# Patient Record
Sex: Female | Born: 1980 | Race: White | Hispanic: No | Smoking: Current every day smoker
Health system: Southern US, Community
[De-identification: ages and names within clinical notes are randomized; demographics above are authoritative.]

## PROBLEM LIST (undated history)

## (undated) DIAGNOSIS — E079 Disorder of thyroid, unspecified: Secondary | ICD-10-CM

## (undated) HISTORY — PX: TUBAL LIGATION: SHX77

---
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
## 2000-10-26 ENCOUNTER — Emergency Department (HOSPITAL_COMMUNITY): Admission: EM | Admit: 2000-10-26 | Discharge: 2000-10-26 | Payer: Self-pay | Admitting: Internal Medicine

## 2000-12-19 ENCOUNTER — Emergency Department (HOSPITAL_COMMUNITY): Admission: EM | Admit: 2000-12-19 | Discharge: 2000-12-19 | Payer: Self-pay | Admitting: Emergency Medicine

## 2001-01-26 ENCOUNTER — Emergency Department (HOSPITAL_COMMUNITY): Admission: EM | Admit: 2001-01-26 | Discharge: 2001-01-26 | Payer: Self-pay | Admitting: Emergency Medicine

## 2001-06-22 ENCOUNTER — Other Ambulatory Visit: Admission: RE | Admit: 2001-06-22 | Discharge: 2001-06-22 | Payer: Self-pay | Admitting: Family Medicine

## 2001-08-15 ENCOUNTER — Encounter: Payer: Self-pay | Admitting: Family Medicine

## 2001-08-15 ENCOUNTER — Ambulatory Visit (HOSPITAL_COMMUNITY): Admission: RE | Admit: 2001-08-15 | Discharge: 2001-08-15 | Payer: Self-pay | Admitting: Family Medicine

## 2001-09-04 ENCOUNTER — Inpatient Hospital Stay (HOSPITAL_COMMUNITY): Admission: AD | Admit: 2001-09-04 | Discharge: 2001-09-04 | Payer: Self-pay | Admitting: *Deleted

## 2001-11-10 ENCOUNTER — Inpatient Hospital Stay (HOSPITAL_COMMUNITY): Admission: AD | Admit: 2001-11-10 | Discharge: 2001-11-10 | Payer: Self-pay | Admitting: Family Medicine

## 2001-12-29 ENCOUNTER — Encounter (HOSPITAL_COMMUNITY): Admission: RE | Admit: 2001-12-29 | Discharge: 2001-12-29 | Payer: Self-pay | Admitting: Family Medicine

## 2001-12-29 ENCOUNTER — Inpatient Hospital Stay (HOSPITAL_COMMUNITY): Admission: AD | Admit: 2001-12-29 | Discharge: 2001-12-31 | Payer: Self-pay | Admitting: Family Medicine

## 2003-08-22 ENCOUNTER — Other Ambulatory Visit: Admission: RE | Admit: 2003-08-22 | Discharge: 2003-08-22 | Payer: Self-pay | Admitting: Family Medicine

## 2003-09-19 ENCOUNTER — Ambulatory Visit (HOSPITAL_COMMUNITY): Admission: RE | Admit: 2003-09-19 | Discharge: 2003-09-19 | Payer: Self-pay | Admitting: Family Medicine

## 2003-12-18 ENCOUNTER — Ambulatory Visit (HOSPITAL_COMMUNITY): Admission: RE | Admit: 2003-12-18 | Discharge: 2003-12-18 | Payer: Self-pay | Admitting: Family Medicine

## 2004-01-14 ENCOUNTER — Ambulatory Visit (HOSPITAL_COMMUNITY): Admission: RE | Admit: 2004-01-14 | Discharge: 2004-01-14 | Payer: Self-pay | Admitting: Family Medicine

## 2004-01-22 ENCOUNTER — Inpatient Hospital Stay (HOSPITAL_COMMUNITY): Admission: AD | Admit: 2004-01-22 | Discharge: 2004-01-22 | Payer: Self-pay | Admitting: Family Medicine

## 2004-02-13 ENCOUNTER — Inpatient Hospital Stay (HOSPITAL_COMMUNITY): Admission: AD | Admit: 2004-02-13 | Discharge: 2004-02-16 | Payer: Self-pay | Admitting: Family Medicine

## 2004-02-15 ENCOUNTER — Encounter (INDEPENDENT_AMBULATORY_CARE_PROVIDER_SITE_OTHER): Payer: Self-pay | Admitting: Specialist

## 2005-06-27 ENCOUNTER — Emergency Department (HOSPITAL_COMMUNITY): Admission: EM | Admit: 2005-06-27 | Discharge: 2005-06-27 | Payer: Self-pay | Admitting: Emergency Medicine

## 2006-06-28 ENCOUNTER — Ambulatory Visit: Payer: Self-pay | Admitting: Family Medicine

## 2007-01-06 ENCOUNTER — Ambulatory Visit: Payer: Self-pay | Admitting: Family Medicine

## 2008-05-23 ENCOUNTER — Other Ambulatory Visit: Admission: RE | Admit: 2008-05-23 | Discharge: 2008-05-23 | Payer: Self-pay | Admitting: Family Medicine

## 2010-01-23 ENCOUNTER — Emergency Department (HOSPITAL_COMMUNITY): Admission: EM | Admit: 2010-01-23 | Discharge: 2010-01-23 | Payer: Self-pay | Admitting: Emergency Medicine

## 2010-12-12 ENCOUNTER — Emergency Department (HOSPITAL_COMMUNITY)
Admission: EM | Admit: 2010-12-12 | Discharge: 2010-12-13 | Disposition: A | Discharge status: Home / Self Care | Payer: BC Managed Care – PPO | Attending: Emergency Medicine | Admitting: Emergency Medicine

## 2010-12-12 ENCOUNTER — Emergency Department (HOSPITAL_COMMUNITY): Payer: BC Managed Care – PPO

## 2010-12-12 DIAGNOSIS — E039 Hypothyroidism, unspecified: Secondary | ICD-10-CM | POA: Insufficient documentation

## 2010-12-12 DIAGNOSIS — R5381 Other malaise: Secondary | ICD-10-CM | POA: Insufficient documentation

## 2010-12-12 DIAGNOSIS — R6883 Chills (without fever): Secondary | ICD-10-CM | POA: Insufficient documentation

## 2010-12-12 DIAGNOSIS — R079 Chest pain, unspecified: Secondary | ICD-10-CM | POA: Insufficient documentation

## 2010-12-12 DIAGNOSIS — F17200 Nicotine dependence, unspecified, uncomplicated: Secondary | ICD-10-CM | POA: Insufficient documentation

## 2010-12-18 NOTE — Op Note (Signed)
NAMECHAPEL, Tammy Wiley                        ACCOUNT NO.:  [id number]   MEDICAL RECORD NO.:  [id number]                   PATIENT TYPE:  INP   LOCATION:  9133                                 FACILITY:  [facility]   PHYSICIAN:  Conni Elliot, M.D.             DATE OF BIRTH:  04/17/1981   DATE OF PROCEDURE:  02/15/2004  DATE OF DISCHARGE:  02/16/2004                                 OPERATIVE REPORT   PREOPERATIVE DIAGNOSIS:  Desires surgical sterilization.   POSTOPERATIVE DIAGNOSIS:  Desires surgical sterilization.   OPERATION:  Modified Pomeroy tubal ligation.   SURGEON:  Conni Elliot, M.D.   DESCRIPTION OF PROCEDURE:  After placing the patient under continuous lumbar  epidural anesthetic with the supine.  Abdomen is prepped and draped in  sterile fashion.  A periumbilical incision is made.  Incision made in the  skin and subcutaneous fashion.  Peritoneum entered.  The right fallopian  tube was identified, grasped with Babcock clamp, followed to fimbriated end.  Single tube was brought into the operative field, doubly suture ligated and  1.5 to 2 cm segment was incised.  Hemostasis was adequate.  Similar  procedure was done on the opposite side.  Hemostasis again adequate.  Anterior peritoneum and fascia as well as skin closed in routine fashion.   ESTIMATED BLOOD LOSS:  Less than 10 mL.   Sponge, needle and instrument counts were correct.                                               Conni Elliot, M.D.    ASG/MEDQ  D:  02/15/2004  T:  02/16/2004  Job:  [id number]

## 2011-10-23 ENCOUNTER — Encounter (HOSPITAL_COMMUNITY): Payer: Self-pay | Admitting: *Deleted

## 2011-10-23 ENCOUNTER — Emergency Department (HOSPITAL_COMMUNITY): Payer: Medicaid Other

## 2011-10-23 ENCOUNTER — Emergency Department (HOSPITAL_COMMUNITY)
Admission: EM | Admit: 2011-10-23 | Discharge: 2011-10-23 | Disposition: A | Discharge status: Home / Self Care | Payer: Medicaid Other | Attending: Emergency Medicine | Admitting: Emergency Medicine

## 2011-10-23 DIAGNOSIS — R209 Unspecified disturbances of skin sensation: Secondary | ICD-10-CM | POA: Insufficient documentation

## 2011-10-23 DIAGNOSIS — E079 Disorder of thyroid, unspecified: Secondary | ICD-10-CM | POA: Insufficient documentation

## 2011-10-23 DIAGNOSIS — M25519 Pain in unspecified shoulder: Secondary | ICD-10-CM | POA: Insufficient documentation

## 2011-10-23 DIAGNOSIS — M542 Cervicalgia: Secondary | ICD-10-CM | POA: Insufficient documentation

## 2011-10-23 DIAGNOSIS — R5381 Other malaise: Secondary | ICD-10-CM | POA: Insufficient documentation

## 2011-10-23 DIAGNOSIS — M2550 Pain in unspecified joint: Secondary | ICD-10-CM | POA: Insufficient documentation

## 2011-10-23 DIAGNOSIS — IMO0001 Reserved for inherently not codable concepts without codable children: Secondary | ICD-10-CM | POA: Insufficient documentation

## 2011-10-23 DIAGNOSIS — M5412 Radiculopathy, cervical region: Secondary | ICD-10-CM

## 2011-10-23 HISTORY — DX: Disorder of thyroid, unspecified: E07.9

## 2011-10-23 MED ORDER — HYDROCODONE-ACETAMINOPHEN 5-325 MG PO TABS
1.0000 | ORAL_TABLET | Freq: Once | ORAL | Status: AC
  Administered 2011-10-23: 1 via ORAL
  Filled 2011-10-23: qty 1

## 2011-10-23 MED ORDER — CYCLOBENZAPRINE HCL 10 MG PO TABS: 10.0000 mg | ORAL_TABLET | Freq: Two times a day (BID) | ORAL | Status: AC | PRN

## 2011-10-23 MED ORDER — HYDROCODONE-ACETAMINOPHEN 5-325 MG PO TABS: 1.0000 | ORAL_TABLET | ORAL | Status: AC | PRN

## 2011-10-23 NOTE — Discharge Instructions (Signed)
Cervical Radiculopathy Cervical radiculopathy happens when a nerve in the neck is pinched or bruised by a slipped (herniated) disk or by arthritic changes in the bones of the cervical spine. This can occur due to an injury or as part of the normal aging process. Pressure on the cervical nerves can cause pain or numbness that runs from your neck all the way down into your arm and fingers. CAUSES  There are many possible causes, including:  Injury.   Muscle tightness in the neck from overuse.   Swollen, painful joints (arthritis).   Breakdown or degeneration in the bones and joints of the spine (spondylosis) due to aging.   Bone spurs that may develop near the cervical nerves.  SYMPTOMS  Symptoms include pain, weakness, or numbness in the affected arm and hand. Pain can be severe or irritating. Symptoms may be worse when extending or turning the neck. DIAGNOSIS  Your caregiver will ask about your symptoms and do a physical exam. He or she may test your strength and reflexes. X-rays, CT scans, and MRI scans may be needed in cases of injury or if the symptoms do not go away after a period of time. Electromyography (EMG) or nerve conduction testing may be done to study how your nerves and muscles are working. TREATMENT  Your caregiver may recommend certain exercises to help relieve your symptoms. Cervical radiculopathy can, and often does, get better with time and treatment. If your problems continue, treatment options may include:  Wearing a soft collar for short periods of time.   Physical therapy to strengthen the neck muscles.   Medicines, such as nonsteroidal anti-inflammatory drugs (NSAIDs), oral corticosteroids, or spinal injections.   Surgery. Different types of surgery may be done depending on the cause of your problems.  HOME CARE INSTRUCTIONS   Put ice on the affected area.   Put ice in a plastic bag.   Place a towel between your skin and the bag.   Leave the ice on for 15  to 20 minutes, 3 to 4 times a day or as directed by your caregiver.   Use a flat pillow when you sleep.   Only take over-the-counter or prescription medicines for pain, discomfort, or fever as directed by your caregiver.   If physical therapy was prescribed, follow your caregiver's directions.   If a soft collar was prescribed, use it as directed.  SEEK IMMEDIATE MEDICAL CARE IF:   Your pain gets much worse and cannot be controlled with medicines.   You have weakness or numbness in your hand, arm, face, or leg.   You have a high fever or a stiff, rigid neck.   You lose bowel or bladder control (incontinence).   You have trouble with walking, balance, or speaking.  MAKE SURE YOU:   Understand these instructions.   Will watch your condition.   Will get help right away if you are not doing well or get worse.  Document Released: 04/13/2001 Document Revised: 07/08/2011 Document Reviewed: 03/02/2011 Exit[employer] Patient Information 2012 [employer]. 

## 2011-10-23 NOTE — ED Notes (Signed)
4 month history of left shoulder numbness radiating down left arm associated with pain and tingling in finger tips. Movement increases pain.Notes cervicale neck pain. Job: Art gallery manager at [employer]. Uses left arm more. Denies any injury. Comes today because pain worse through the night.

## 2011-10-23 NOTE — ED Provider Notes (Signed)
History     CSN: [id number]  Arrival date & time 10/23/11  1534   First MD Initiated Contact with Patient 10/23/11 1613      Chief Complaint  Patient presents with  . Shoulder Pain    left    (Consider location/radiation/quality/duration/timing/severity/associated sxs/prior treatment) HPI Comments: Patient here with left sided neck pain with radiation down her left arm for the past 4 months - she reports pain worse with movement of the arm, pain and occasional numbness and tingling into her fingertips - reports pain radiates down the entire arm - reports subjective weakness in the arm and decreased range of motion of the shoulder and neck.  Patient is a 31 y.o. female presenting with shoulder pain. The history is provided by the patient. No language interpreter was used.  Shoulder Pain This is a new problem. The current episode started more than 1 month ago. The problem occurs constantly. The problem has been gradually worsening. Associated symptoms include arthralgias, myalgias, neck pain, numbness and weakness. Pertinent negatives include no abdominal pain, anorexia, change in bowel habit, chest pain, chills, congestion, coughing, diaphoresis, fatigue, fever, headaches, joint swelling, nausea, rash, sore throat, swollen glands, urinary symptoms, vertigo, visual change or vomiting. The symptoms are aggravated by bending. She has tried nothing for the symptoms. The treatment provided no relief.    Past Medical History  Diagnosis Date  . Thyroid disease     History reviewed. No pertinent past surgical history.  No family history on file.  History  Substance Use Topics  . Smoking status: Not on file  . Smokeless tobacco: Not on file  . Alcohol Use:     OB History    Grav Para Term Preterm Abortions TAB SAB Ect Mult Living                  Review of Systems  Constitutional: Negative for fever, chills, diaphoresis and fatigue.  HENT: Positive for neck pain. Negative for  congestion and sore throat.   Respiratory: Negative for cough.   Cardiovascular: Negative for chest pain.  Gastrointestinal: Negative for nausea, vomiting, abdominal pain, anorexia and change in bowel habit.  Musculoskeletal: Positive for myalgias and arthralgias. Negative for joint swelling.  Skin: Negative for rash.  Neurological: Positive for weakness and numbness. Negative for vertigo and headaches.  All other systems reviewed and are negative.    Allergies  Review of patient's allergies indicates no known allergies.  Home Medications   Current Outpatient Rx  Name Route Sig Dispense Refill  . LEVOTHYROXINE SODIUM 200 MCG PO TABS Oral Take 200 mcg by mouth daily.      BP 135/87  Pulse 72  Temp(Src) 98.7 F (37.1 C) (Oral)  Resp 16  SpO2 100%  Physical Exam  Nursing note and vitals reviewed. Constitutional: She is oriented to person, place, and time. She appears well-developed and well-nourished. No distress.  HENT:  Head: Normocephalic and atraumatic.  Right Ear: External ear normal.  Left Ear: External ear normal.  Nose: Nose normal.  Mouth/Throat: Oropharynx is clear and moist. No oropharyngeal exudate.  Eyes: Conjunctivae are normal. Pupils are equal, round, and reactive to light. No scleral icterus.  Neck: Trachea normal. Neck supple. Spinous process tenderness and muscular tenderness present. Decreased range of motion present. No edema and no erythema present. No mass present.    Cardiovascular: Normal rate, regular rhythm and normal heart sounds.  Exam reveals no gallop and no friction rub.   No murmur heard. Pulmonary/Chest: Effort  normal and breath sounds normal. No respiratory distress. She has no wheezes. She has no rales. She exhibits no tenderness.  Abdominal: Soft. Bowel sounds are normal. She exhibits no distension. There is no tenderness.  Musculoskeletal:       Left shoulder: She exhibits decreased range of motion, tenderness and bony tenderness. She  exhibits no swelling, no effusion, no deformity, normal pulse and normal strength.  Neurological: She is alert and oriented to person, place, and time. She displays normal reflexes. No cranial nerve deficit. She exhibits normal muscle tone. Coordination normal.  Skin: Skin is warm and dry. No rash noted. No erythema. No pallor.  Psychiatric: She has a normal mood and affect. Her behavior is normal. Judgment and thought content normal.    ED Course  Procedures (including critical care time)  Labs Reviewed - No data to display No results found.  No results found for this or any previous visit. Ct Cervical Spine Wo Contrast  10/23/2011  *RADIOLOGY REPORT*  Clinical Data: 70-month history of left shoulder numbness radiating to the left arm with pain and tingling in the fingertips.  CT CERVICAL SPINE WITHOUT CONTRAST  Technique:  Multidetector CT imaging of the cervical spine was performed. Multiplanar CT image reconstructions were also generated.  Comparison: None.  Findings: The neck is in mild flexion.  Vertebral body height and alignment are normal.  No notable degenerative change is identified by CT scan.  Lung apices demonstrate some paraseptal emphysema.  IMPRESSION:  1.  Normal appearing cervical spine. 2.  Paraseptal emphysema.  Original Report Authenticated By: Bernadene Bell. Maricela Curet, M.D.     Cervical Radiculopathy    MDM  Patient without signs of cord impingement though she does have periods of numbness, no weakness noted on examination - CT scan reassuring - will give pain medication and muscle relaxer for short term and patient to follow up with PCP for further imaging and referral.        Scarlette Calico C. [place], [place] 10/23/11 1758

## 2011-10-23 NOTE — ED Provider Notes (Signed)
Medical screening examination/treatment/procedure(s) were performed by non-physician practitioner and as supervising physician I was immediately available for consultation/collaboration.   Gale Hulse, MD 10/23/11 2309 

## 2013-04-09 ENCOUNTER — Encounter (HOSPITAL_COMMUNITY): Payer: Self-pay | Admitting: *Deleted

## 2013-04-09 ENCOUNTER — Emergency Department (HOSPITAL_COMMUNITY)
Admission: EM | Admit: 2013-04-09 | Discharge: 2013-04-09 | Disposition: A | Discharge status: Home / Self Care | Payer: Medicaid Other | Attending: Emergency Medicine | Admitting: Emergency Medicine

## 2013-04-09 DIAGNOSIS — Z9851 Tubal ligation status: Secondary | ICD-10-CM | POA: Insufficient documentation

## 2013-04-09 DIAGNOSIS — Z79899 Other long term (current) drug therapy: Secondary | ICD-10-CM | POA: Insufficient documentation

## 2013-04-09 DIAGNOSIS — G479 Sleep disorder, unspecified: Secondary | ICD-10-CM | POA: Insufficient documentation

## 2013-04-09 DIAGNOSIS — R5383 Other fatigue: Secondary | ICD-10-CM

## 2013-04-09 DIAGNOSIS — F17200 Nicotine dependence, unspecified, uncomplicated: Secondary | ICD-10-CM | POA: Insufficient documentation

## 2013-04-09 DIAGNOSIS — R5381 Other malaise: Secondary | ICD-10-CM | POA: Insufficient documentation

## 2013-04-09 DIAGNOSIS — E079 Disorder of thyroid, unspecified: Secondary | ICD-10-CM | POA: Insufficient documentation

## 2013-04-09 LAB — CBC
HCT: 37.5 % (ref 36.0–46.0)
MCV: 89.9 fL (ref 78.0–100.0)
Platelets: 227 10*3/uL (ref 150–400)
RBC: 4.17 MIL/uL (ref 3.87–5.11)
WBC: 7.9 10*3/uL (ref 4.0–10.5)

## 2013-04-09 NOTE — ED Notes (Signed)
Pt states "i'm just here to have my thyroid checked"

## 2013-04-09 NOTE — ED Notes (Signed)
Reports needs to have thyroid checked.  Is having unintentional weight loss of 40 lbs within past month, unable to stay awake and is having bruising.  C/o pain all over.

## 2013-04-09 NOTE — ED Provider Notes (Signed)
CSN: [id number]     Arrival date & time 04/09/13  1716 History   First MD Initiated Contact with Patient 04/09/13 1753     Chief Complaint  Patient presents with  . Weight Loss   (Consider location/radiation/quality/duration/timing/severity/associated sxs/prior Treatment) HPI Comments: Patient's aunt, who is a Research scientist (medical), sent her here to get her an anemia panel checked and thyroid studies checked. The patient has been experiencing a 40 pound weight loss and sleepless nights over the past several months. The patient has not been tried to lose weight. She's her stress at home because her husband left her and took her children. She also reports many other things have been going on with her life. She denies any active bleeding, heavier periods of normal, rectal bleeding. She just states she is fatigued and is doesn't have energy. She denies any suicidal or homicidal ideations.  Patient is a 32 y.o. female presenting with general illness. The history is provided by the patient.  Illness Location:  Diffuse Quality:  Weight loss, fatigue Severity:  Mild Onset quality:  Gradual Timing:  Constant Progression:  Worsening Chronicity:  New Associated symptoms: fatigue   Associated symptoms: no abdominal pain, no chest pain, no congestion, no cough, no diarrhea, no ear pain, no fever, no headaches, no loss of consciousness, no myalgias, no nausea, no rash, no rhinorrhea, no shortness of breath, no sore throat, no vomiting and no wheezing     Past Medical History  Diagnosis Date  . Thyroid disease    Past Surgical History  Procedure Laterality Date  . Tubal ligation     No family history on file. History  Substance Use Topics  . Smoking status: Current Every Day Smoker    Types: Cigarettes  . Smokeless tobacco: Not on file  . Alcohol Use: No   OB History   Grav Para Term Preterm Abortions TAB SAB Ect Mult Living                 Review of Systems  Constitutional: Positive for  fatigue. Negative for fever.  HENT: Negative for ear pain, congestion, sore throat and rhinorrhea.   Respiratory: Negative for cough, shortness of breath and wheezing.   Cardiovascular: Negative for chest pain.  Gastrointestinal: Negative for nausea, vomiting, abdominal pain and diarrhea.  Musculoskeletal: Negative for myalgias.  Skin: Negative for rash.  Neurological: Negative for loss of consciousness and headaches.  All other systems reviewed and are negative.    Allergies  Review of patient's allergies indicates no known allergies.  Home Medications   Current Outpatient Rx  Name  Route  Sig  Dispense  Refill  . levothyroxine (SYNTHROID, LEVOTHROID) 150 MCG tablet   Oral   Take 150 mcg by mouth daily.          BP 157/95  Pulse 60  Temp(Src) 98.5 F (36.9 C) (Oral)  Resp 16  Ht 5\' 4"  (1.626 m)  Wt 135 lb 3 oz (61.321 kg)  BMI 23.19 kg/m2  SpO2 100%  LMP 04/02/2013 Physical Exam  Nursing note and vitals reviewed. Constitutional: She is oriented to person, place, and time. She appears well-developed and well-nourished. No distress.  HENT:  Head: Normocephalic and atraumatic.  Eyes: EOM are normal. Pupils are equal, round, and reactive to light.  Neck: Normal range of motion. Neck supple.  Cardiovascular: Normal rate and regular rhythm.  Exam reveals no friction rub.   No murmur heard. Pulmonary/Chest: Effort normal and breath sounds normal. No respiratory distress. She  has no wheezes. She has no rales.  Abdominal: Soft. She exhibits no distension. There is no tenderness. There is no rebound.  Musculoskeletal: Normal range of motion. She exhibits no edema.  Neurological: She is alert and oriented to person, place, and time.  Skin: She is not diaphoretic.    ED Course  Procedures (including critical care time) Labs Review Labs Reviewed  TSH  T4, FREE  CBC   Imaging Review No results found.  MDM   1. Fatigue    32 year old female here requesting labs  for concern your thyroid medication. Our lab will not get thyroid tests back, however she is persistent that she needs these labs. I explained to her this is not good use of ED resources, however she does understand and persistent that she needs to labs. I will check the studies for her and informed her she is to follow up with her regular doctor for adjustments of her thyroid medication. Not anemic. Stable for discharge.   Dagmar Hait, MD 04/09/13 2059

## 2013-06-07 ENCOUNTER — Other Ambulatory Visit: Payer: Self-pay

## 2016-03-22 ENCOUNTER — Ambulatory Visit: Payer: Self-pay | Admitting: Family Medicine

## 2016-03-30 ENCOUNTER — Ambulatory Visit: Payer: Self-pay | Admitting: Family Medicine

## 2016-03-31 ENCOUNTER — Encounter: Payer: Self-pay | Admitting: Family Medicine

## 2018-12-18 ENCOUNTER — Other Ambulatory Visit: Payer: Self-pay

## 2018-12-18 ENCOUNTER — Emergency Department (HOSPITAL_COMMUNITY)
Admission: EM | Admit: 2018-12-18 | Discharge: 2018-12-18 | Disposition: A | Discharge status: Home / Self Care | Payer: Self-pay | Attending: Emergency Medicine | Admitting: Emergency Medicine

## 2018-12-18 ENCOUNTER — Encounter (HOSPITAL_COMMUNITY): Payer: Self-pay | Admitting: Emergency Medicine

## 2018-12-18 DIAGNOSIS — K0889 Other specified disorders of teeth and supporting structures: Secondary | ICD-10-CM | POA: Insufficient documentation

## 2018-12-18 DIAGNOSIS — F17210 Nicotine dependence, cigarettes, uncomplicated: Secondary | ICD-10-CM | POA: Insufficient documentation

## 2018-12-18 MED ORDER — AMOXICILLIN-POT CLAVULANATE 875-125 MG PO TABS
1.0000 | ORAL_TABLET | Freq: Once | ORAL | Status: AC
  Administered 2018-12-18: 1 via ORAL
  Filled 2018-12-18: qty 1

## 2018-12-18 MED ORDER — BENZOCAINE 10 % MT GEL: 1.0000 "application " | OROMUCOSAL | 0 refills | Status: AC | PRN

## 2018-12-18 MED ORDER — AMOXICILLIN-POT CLAVULANATE 875-125 MG PO TABS: 1.0000 | ORAL_TABLET | Freq: Two times a day (BID) | ORAL | 0 refills | Status: AC

## 2018-12-18 MED ORDER — IBUPROFEN 400 MG PO TABS
600.0000 mg | ORAL_TABLET | Freq: Once | ORAL | Status: AC
  Administered 2018-12-18: 600 mg via ORAL
  Filled 2018-12-18: qty 2

## 2018-12-18 NOTE — Discharge Instructions (Signed)
Please take entire course of antibiotics as directed.  Continue using ibuprofen and Tylenol, as well as prescribed Orajel for pain.  You will need to follow-up with your dentist for continued management of this.  Return to the emergency department for fevers, swelling or pain under the tongue or in the neck, difficulty breathing or swallowing or any other new or concerning symptoms. 

## 2018-12-18 NOTE — ED Triage Notes (Signed)
Left upper toothache since Friday.

## 2018-12-18 NOTE — ED Provider Notes (Signed)
[facility] EMERGENCY DEPARTMENT Provider Note   CSN: [id number] Arrival date & time: 12/18/18  1837    History   Chief Complaint Chief Complaint  Patient presents with  . Dental Pain    HPI Tammy Wiley is a 38 y.o. female.     Tammy Wiley is a 38 y.o. female with a history of thyroid disease, who presents to the emergency department for evaluation of dental pain.  She reports a toothache over the left upper molars that started ago.  Pain was initially mild but has become a constant throbbing ache.  She noticed some mild facial swelling over her left cheek over the weekend.  She has not had any fever.  She denies any pain or swelling under the tongue, no difficulty swallowing, no pain or stiffness in the neck.  She took 1 dose of ibuprofen with some improvement of her eye, has not taken anything else for pain.  Reports a broken tooth in this area but no prior history of infections.  Does not have a dentist that she regularly follows with.  Denies any relieving factors.     Past Medical History:  Diagnosis Date  . Thyroid disease     There are no active problems to display for this patient.   Past Surgical History:  Procedure Laterality Date  . TUBAL LIGATION       OB History   No obstetric history on file.      Home Medications    Prior to Admission medications   Medication Sig Start Date End Date Taking? Authorizing Provider  amoxicillin-clavulanate (AUGMENTIN) 875-125 MG tablet Take 1 tablet by mouth 2 (two) times daily. One po bid x 7 days 12/18/18   Dartha Lodge, PA-C  benzocaine (ORAJEL) 10 % mucosal gel Use as directed 1 application in the mouth or throat as needed for mouth pain. 12/18/18   Dartha Lodge, PA-C  levothyroxine (SYNTHROID, LEVOTHROID) 150 MCG tablet Take 150 mcg by mouth daily.    [provider]    Family History No family history on file.  Social History Social History   Tobacco Use  . Smoking status: Current Every  Day Smoker    Packs/day: 1.00    Types: Cigarettes  . Smokeless tobacco: Never Used  Substance Use Topics  . Alcohol use: No  . Drug use: No     Allergies   Tramadol   Review of Systems Review of Systems  Constitutional: Negative for chills and fever.  HENT: Positive for dental problem and facial swelling.   Eyes: Negative for pain.  Respiratory: Negative for stridor.   Gastrointestinal: Negative for nausea and vomiting.  Musculoskeletal: Negative for neck stiffness.  Skin: Negative for color change and rash.     Physical Exam Updated Vital Signs BP (!) 142/93   Pulse 63   Temp 98.1 F (36.7 C)   Resp 18   Ht  (1.651 m)   Wt 56.7 kg   LMP 12/13/2018 (Approximate)   SpO2 97%   BMI 20.80 kg/m   Physical Exam Vitals signs and nursing note reviewed.  Constitutional:      General: She is not in acute distress.    Appearance: Normal appearance. She is well-developed and normal weight. She is not diaphoretic.  HENT:     Head: Normocephalic and atraumatic.     Mouth/Throat:     Comments: Erythema of the gums over the left upper second molar which is broken and decaying.  There is no drainage or obvious abscess, small amount of swelling over the left cheek but no palpable area of fluctuance.  There is no sublingual tenderness or induration.  Posterior oropharynx clear.  Tolerating secretions, normal phonation, no trismus. Eyes:     General:        Right eye: No discharge.        Left eye: No discharge.  Neck:     Musculoskeletal: Neck supple.     Comments: No stridor, no tenderness or masses. Pulmonary:     Effort: Pulmonary effort is normal. No respiratory distress.  Skin:    General: Skin is warm and dry.  Neurological:     Mental Status: She is alert.     Coordination: Coordination normal.  Psychiatric:        Mood and Affect: Mood normal.        Behavior: Behavior normal.      ED Treatments / Results  Labs (all labs ordered are listed, but only  abnormal results are displayed) Labs Reviewed - No data to display  EKG None  Radiology No results found.  Procedures Procedures (including critical care time)  Medications Ordered in ED Medications  ibuprofen (ADVIL) tablet 600 mg (has no administration in time range)  amoxicillin-clavulanate (AUGMENTIN) 875-125 MG per tablet 1 tablet (has no administration in time range)     Initial Impression / Assessment and Plan / ED Course  I have reviewed the triage vital signs and the nursing notes.  Pertinent labs & imaging results that were available during my care of the patient were reviewed by me and considered in my medical decision making (see chart for details).  Patient with toothache.  No gross abscess.  Exam unconcerning for Ludwig's angina or spread of infection.  Will treat withaugmentin and anti-inflammatories medicine.  Urged patient to follow-up with dentist, dental resources provided.   Final Clinical Impressions(s) / ED Diagnoses   Final diagnoses:  Toothache    ED Discharge Orders         Ordered    amoxicillin-clavulanate (AUGMENTIN) 875-125 MG tablet  2 times daily     12/18/18 2052    benzocaine (ORAJEL) 10 % mucosal gel  As needed     12/18/18 2052           Dartha Lodge, [place] 12/19/18 1400    Loren Racer, MD 12/19/18 [phone]

## 2019-01-27 ENCOUNTER — Encounter (HOSPITAL_COMMUNITY): Payer: Self-pay | Admitting: Emergency Medicine

## 2019-01-27 ENCOUNTER — Emergency Department (HOSPITAL_COMMUNITY)
Admission: EM | Admit: 2019-01-27 | Discharge: 2019-01-27 | Disposition: A | Discharge status: Home / Self Care | Payer: Self-pay | Attending: Emergency Medicine | Admitting: Emergency Medicine

## 2019-01-27 ENCOUNTER — Other Ambulatory Visit: Payer: Self-pay

## 2019-01-27 ENCOUNTER — Emergency Department (HOSPITAL_COMMUNITY): Payer: Self-pay

## 2019-01-27 DIAGNOSIS — M79641 Pain in right hand: Secondary | ICD-10-CM | POA: Insufficient documentation

## 2019-01-27 DIAGNOSIS — S0181XA Laceration without foreign body of other part of head, initial encounter: Secondary | ICD-10-CM

## 2019-01-27 DIAGNOSIS — Y999 Unspecified external cause status: Secondary | ICD-10-CM | POA: Insufficient documentation

## 2019-01-27 DIAGNOSIS — M25512 Pain in left shoulder: Secondary | ICD-10-CM | POA: Insufficient documentation

## 2019-01-27 DIAGNOSIS — W208XXA Other cause of strike by thrown, projected or falling object, initial encounter: Secondary | ICD-10-CM | POA: Insufficient documentation

## 2019-01-27 DIAGNOSIS — F17210 Nicotine dependence, cigarettes, uncomplicated: Secondary | ICD-10-CM | POA: Insufficient documentation

## 2019-01-27 DIAGNOSIS — Y939 Activity, unspecified: Secondary | ICD-10-CM | POA: Insufficient documentation

## 2019-01-27 DIAGNOSIS — S01511A Laceration without foreign body of lip, initial encounter: Secondary | ICD-10-CM | POA: Insufficient documentation

## 2019-01-27 DIAGNOSIS — Z79899 Other long term (current) drug therapy: Secondary | ICD-10-CM | POA: Insufficient documentation

## 2019-01-27 DIAGNOSIS — Y92009 Unspecified place in unspecified non-institutional (private) residence as the place of occurrence of the external cause: Secondary | ICD-10-CM | POA: Insufficient documentation

## 2019-01-27 MED ORDER — LIDOCAINE-EPINEPHRINE-TETRACAINE (LET) SOLUTION
3.0000 mL | Freq: Once | NASAL | Status: AC
  Administered 2019-01-27: 16:00:00 3 mL via TOPICAL
  Filled 2019-01-27: qty 3

## 2019-01-27 MED ORDER — LIDOCAINE HCL (PF) 1 % IJ SOLN
5.0000 mL | Freq: Once | INTRAMUSCULAR | Status: AC
  Administered 2019-01-27: 15:00:00 5 mL via INTRADERMAL
  Filled 2019-01-27: qty 6

## 2019-01-27 NOTE — Discharge Instructions (Addendum)
I have placed 1 suture to your upper lip, please have these removed within 5 to 7 days.  You may apply Neosporin or bacitracin to the area, please keep this area away from the sun.
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
## 2019-01-27 NOTE — ED Notes (Signed)
To Rad 

## 2019-01-27 NOTE — ED Notes (Signed)
Awaiting DC paperwork 
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
## 2019-01-27 NOTE — ED Notes (Signed)
JS, PA in to assess
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
## 2020-01-17 IMAGING — DX LEFT SHOULDER - 2+ VIEW
3 series · 3 of 3 positions shown · non-contrast
Comparison: None.

CLINICAL DATA: Pain following fall

EXAM:
LEFT SHOULDER - 2+ VIEW

[shoulder grashey]
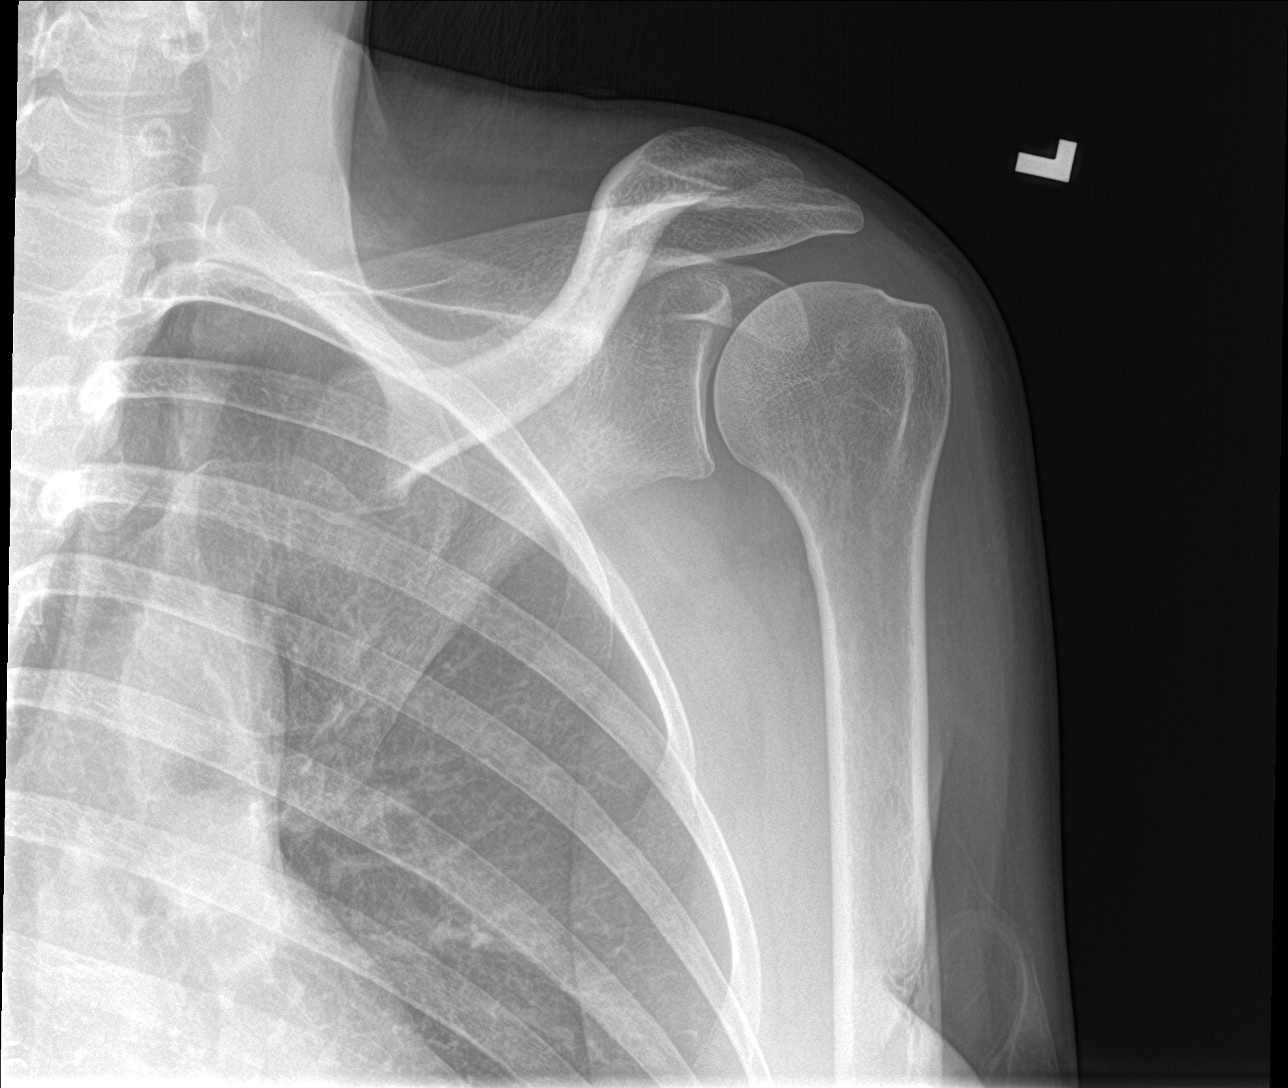

[shoulder y view]
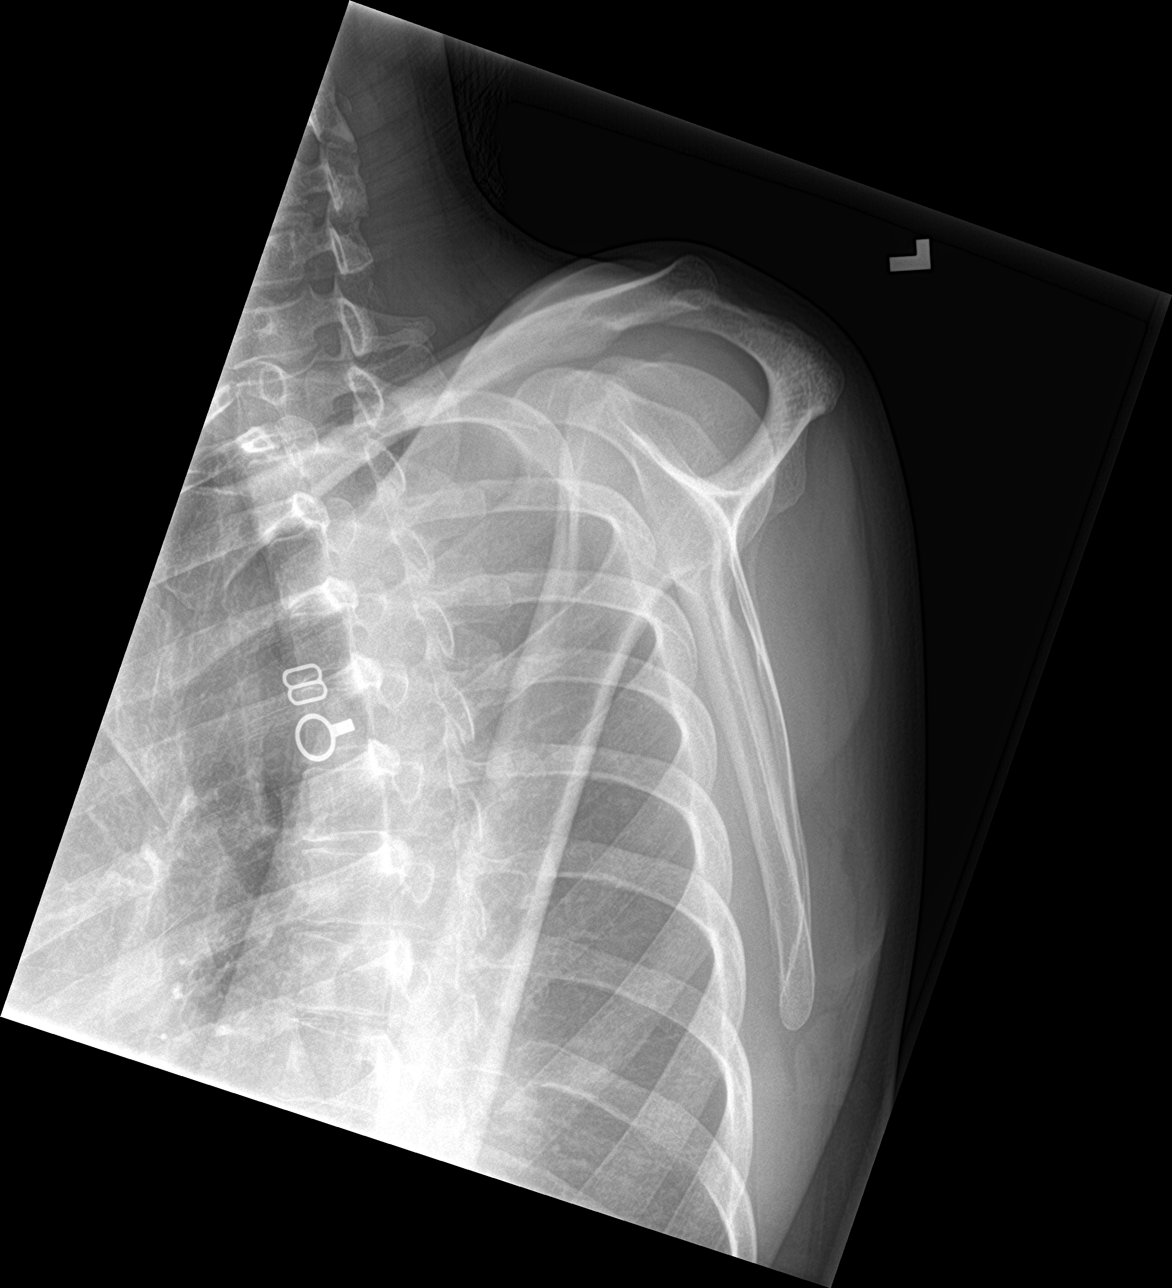

[shoulder axillary]
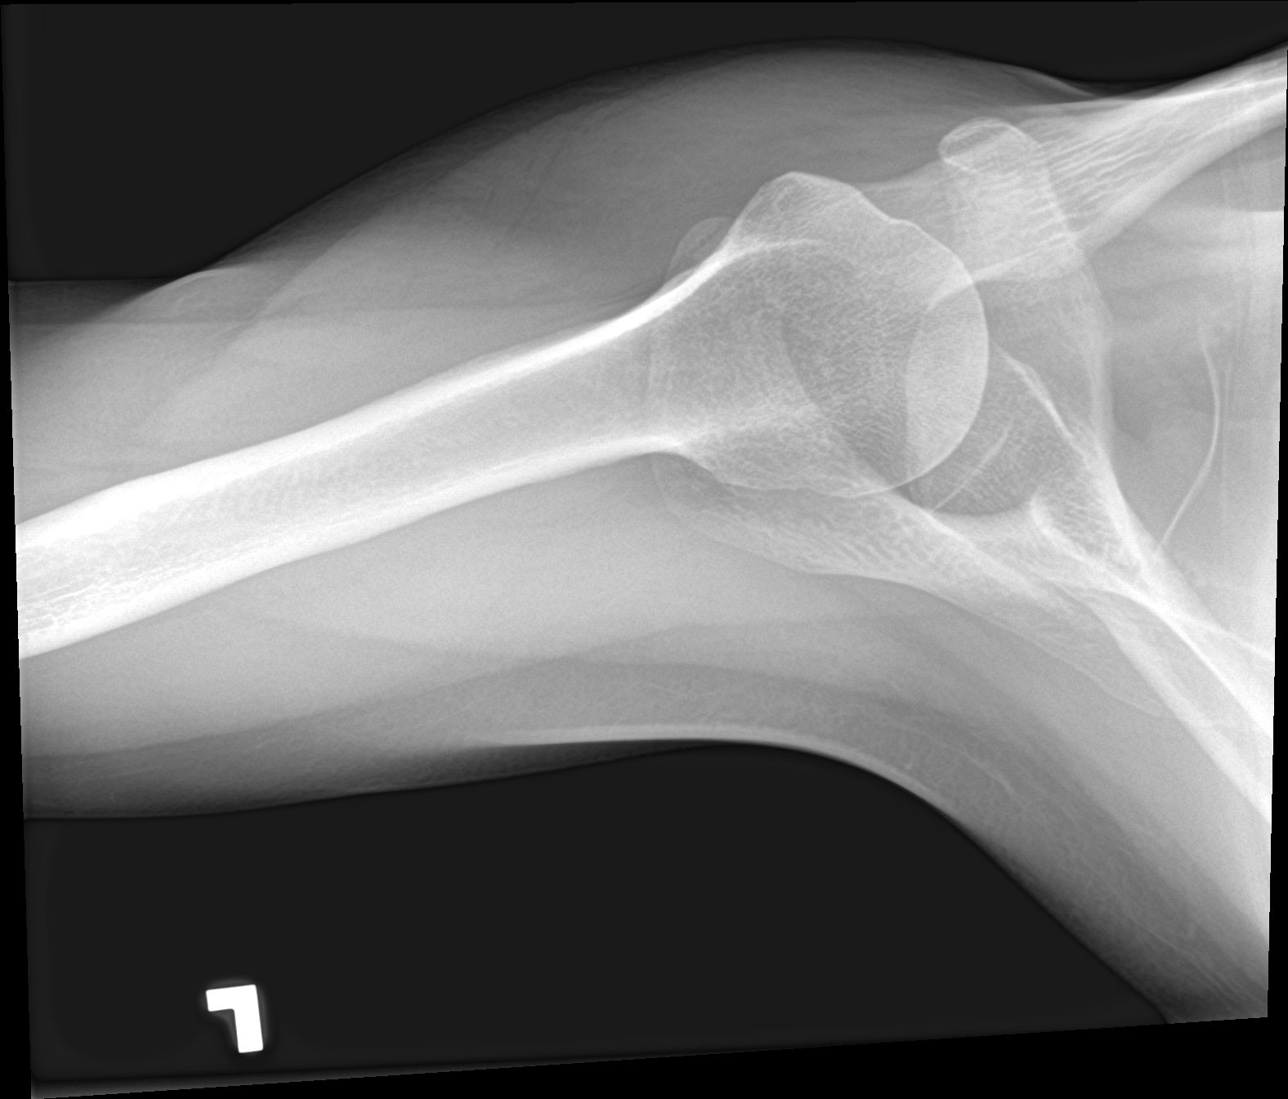

[3 of 3 positions shown; findings below may reference images not displayed]

FINDINGS: Oblique, Y scapular, axillary images were obtained. There is no
fracture or dislocation. Joint spaces appear normal. No erosive
change. Visualized left lung clear.
IMPRESSION: No fracture or dislocation.  No evident arthropathy.
